# Patient Record
Sex: Male | Born: 1975 | Race: Black or African American | Hispanic: No | Marital: Single | Smoking: Current every day smoker
Health system: Southern US, Community
[De-identification: ages and names within clinical notes are randomized; demographics above are authoritative.]

## PROBLEM LIST (undated history)

## (undated) DIAGNOSIS — K259 Gastric ulcer, unspecified as acute or chronic, without hemorrhage or perforation: Secondary | ICD-10-CM

## (undated) DIAGNOSIS — R51 Headache: Secondary | ICD-10-CM

## (undated) DIAGNOSIS — K4090 Unilateral inguinal hernia, without obstruction or gangrene, not specified as recurrent: Secondary | ICD-10-CM

## (undated) DIAGNOSIS — R519 Headache, unspecified: Secondary | ICD-10-CM

---

## 2009-02-27 ENCOUNTER — Ambulatory Visit: Payer: Self-pay | Admitting: Diagnostic Radiology

## 2009-02-27 ENCOUNTER — Emergency Department (HOSPITAL_BASED_OUTPATIENT_CLINIC_OR_DEPARTMENT_OTHER): Admission: EM | Admit: 2009-02-27 | Discharge: 2009-02-28 | Payer: Self-pay | Admitting: Emergency Medicine

## 2009-04-01 ENCOUNTER — Emergency Department (HOSPITAL_BASED_OUTPATIENT_CLINIC_OR_DEPARTMENT_OTHER): Admission: EM | Admit: 2009-04-01 | Discharge: 2009-04-02 | Payer: Self-pay | Admitting: Emergency Medicine

## 2010-03-30 LAB — CARBOXYHEMOGLOBIN: Carboxyhemoglobin: 13.2 % (ref 0.5–1.5)

## 2011-07-18 IMAGING — CR DG CHEST 2V
2 series · 2 of 2 positions shown · non-contrast
Comparison: None.

CLINICAL DATA: Left-sided chest pain since yesterday.  Pain
radiates under the arm.

CHEST - 2 VIEW

[w chest pa]
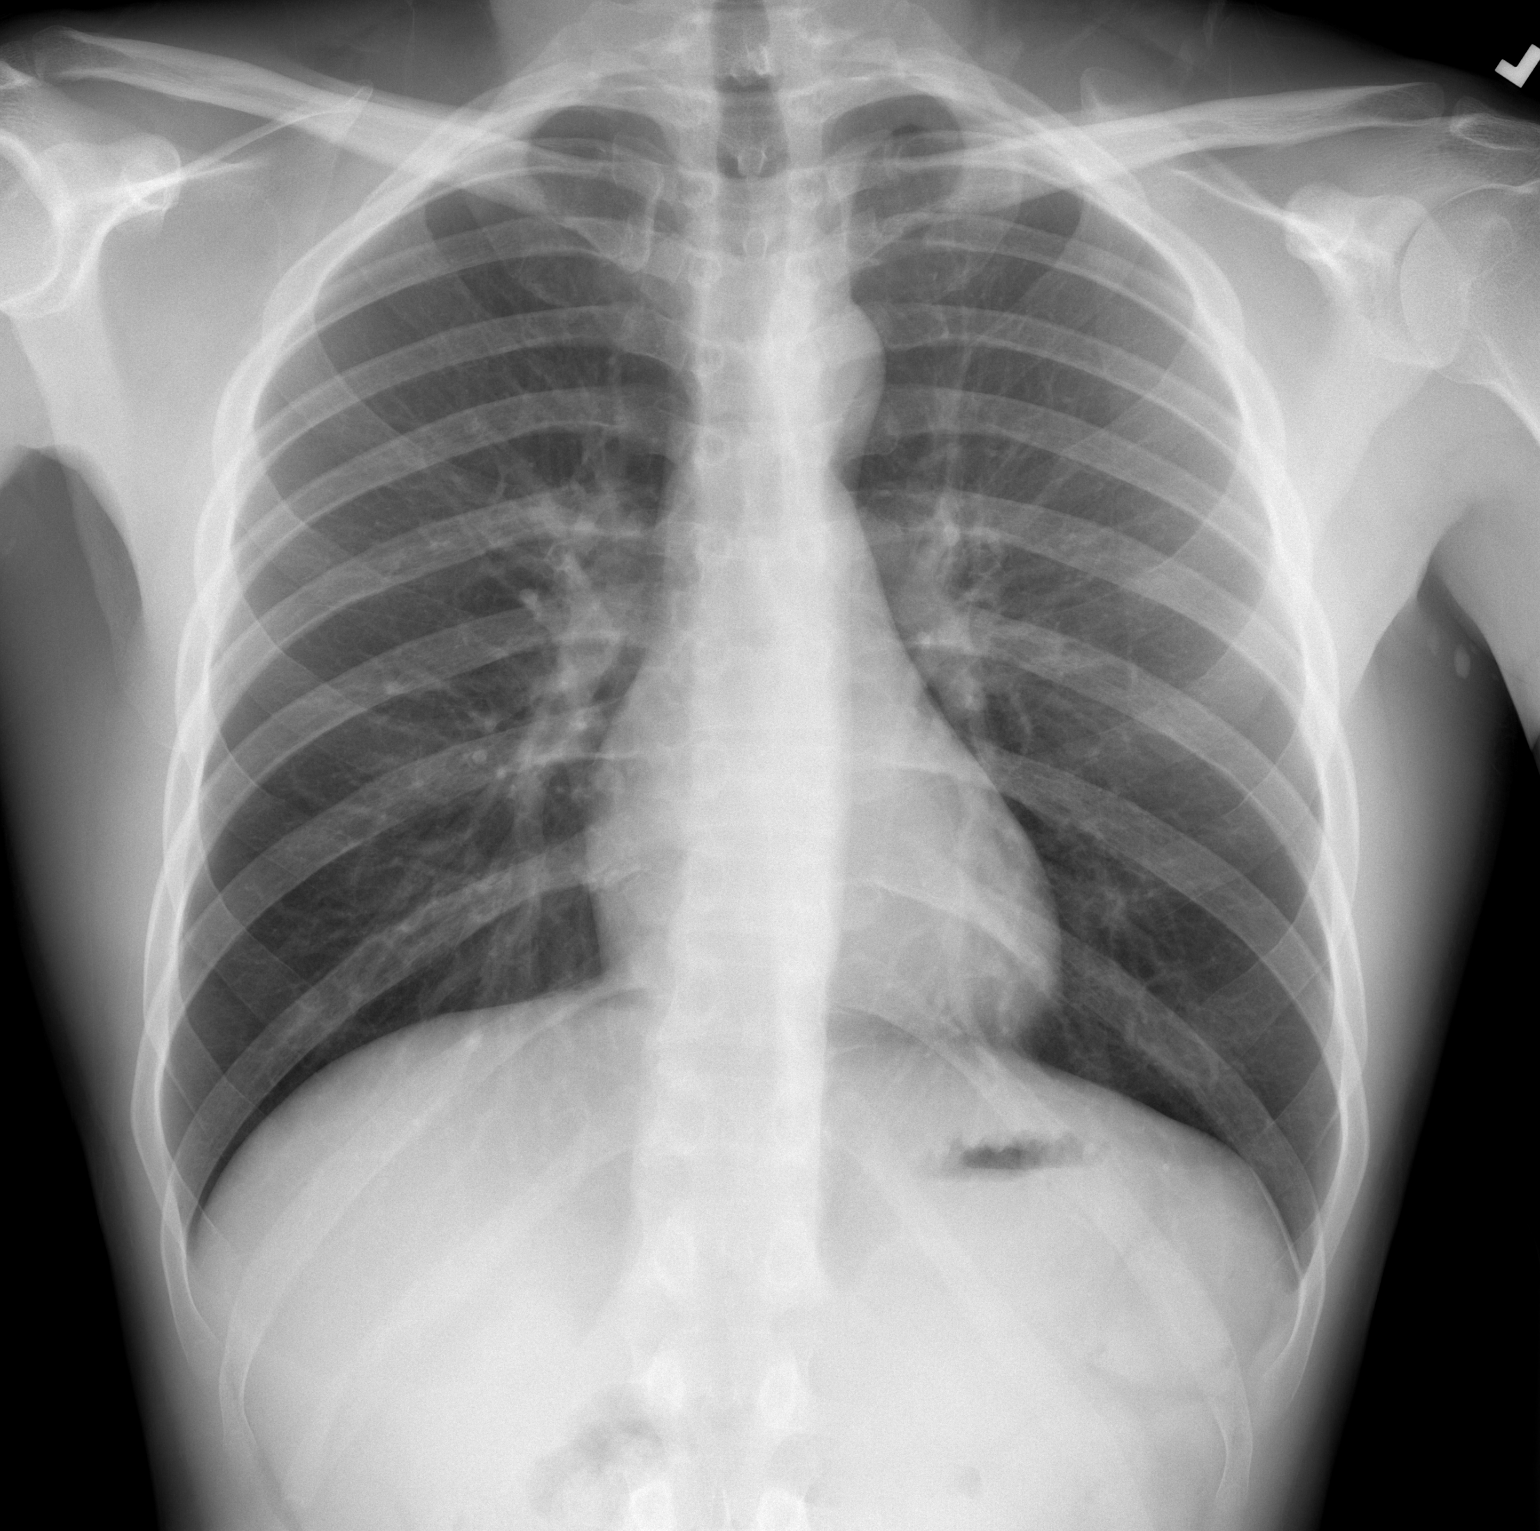

[w chest lat]
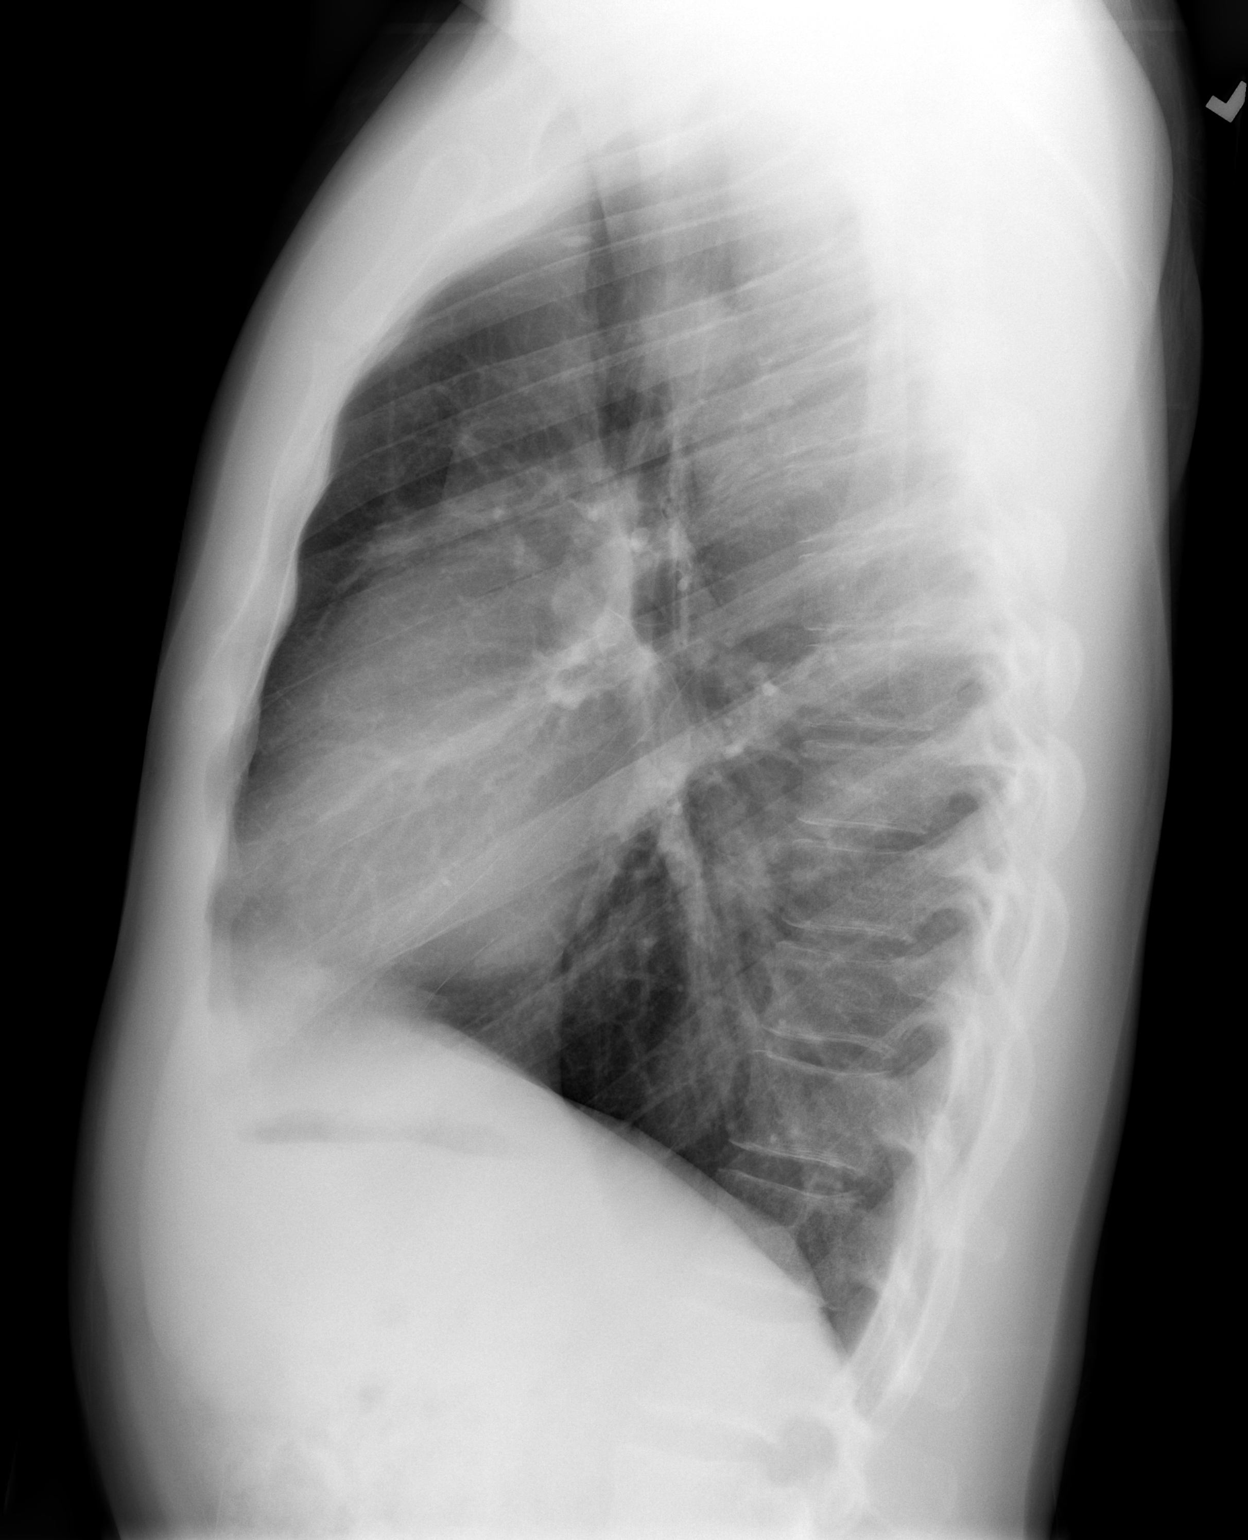

[2 of 2 positions shown; findings below may reference images not displayed]

FINDINGS: Cardiomediastinal silhouette is within normal limits.
The lungs are free of focal consolidations and pleural effusions.
Bony structures have a normal appearance.
IMPRESSION: Negative exam.

## 2012-10-31 ENCOUNTER — Encounter (HOSPITAL_BASED_OUTPATIENT_CLINIC_OR_DEPARTMENT_OTHER): Payer: Self-pay | Admitting: Emergency Medicine

## 2012-10-31 ENCOUNTER — Emergency Department (HOSPITAL_BASED_OUTPATIENT_CLINIC_OR_DEPARTMENT_OTHER)
Admission: EM | Admit: 2012-10-31 | Discharge: 2012-10-31 | Disposition: A | Discharge status: Home / Self Care | Payer: Self-pay | Attending: Emergency Medicine | Admitting: Emergency Medicine

## 2012-10-31 ENCOUNTER — Emergency Department (HOSPITAL_BASED_OUTPATIENT_CLINIC_OR_DEPARTMENT_OTHER): Payer: Self-pay

## 2012-10-31 DIAGNOSIS — R112 Nausea with vomiting, unspecified: Secondary | ICD-10-CM | POA: Insufficient documentation

## 2012-10-31 DIAGNOSIS — F17200 Nicotine dependence, unspecified, uncomplicated: Secondary | ICD-10-CM | POA: Insufficient documentation

## 2012-10-31 DIAGNOSIS — R109 Unspecified abdominal pain: Secondary | ICD-10-CM

## 2012-10-31 DIAGNOSIS — R197 Diarrhea, unspecified: Secondary | ICD-10-CM | POA: Insufficient documentation

## 2012-10-31 DIAGNOSIS — R630 Anorexia: Secondary | ICD-10-CM | POA: Insufficient documentation

## 2012-10-31 DIAGNOSIS — R1032 Left lower quadrant pain: Secondary | ICD-10-CM | POA: Insufficient documentation

## 2012-10-31 DIAGNOSIS — Z8719 Personal history of other diseases of the digestive system: Secondary | ICD-10-CM | POA: Insufficient documentation

## 2012-10-31 DIAGNOSIS — Z8711 Personal history of peptic ulcer disease: Secondary | ICD-10-CM | POA: Insufficient documentation

## 2012-10-31 HISTORY — DX: Gastric ulcer, unspecified as acute or chronic, without hemorrhage or perforation: K25.9

## 2012-10-31 HISTORY — DX: Unilateral inguinal hernia, without obstruction or gangrene, not specified as recurrent: K40.90

## 2012-10-31 LAB — CBC WITH DIFFERENTIAL/PLATELET
Lymphocytes Relative: 27 % (ref 12–46)
Lymphs Abs: 1.8 10*3/uL (ref 0.7–4.0)
MCHC: 33.7 g/dL (ref 30.0–36.0)
Monocytes Absolute: 0.8 10*3/uL (ref 0.1–1.0)
Neutrophils Relative %: 59 % (ref 43–77)
Platelets: 213 10*3/uL (ref 150–400)
RBC: 4.96 MIL/uL (ref 4.22–5.81)
RDW: 14.7 % (ref 11.5–15.5)

## 2012-10-31 LAB — COMPREHENSIVE METABOLIC PANEL
ALT: 16 U/L (ref 0–53)
AST: 26 U/L (ref 0–37)
Albumin: 4 g/dL (ref 3.5–5.2)
Alkaline Phosphatase: 67 U/L (ref 39–117)
Calcium: 9.7 mg/dL (ref 8.4–10.5)
Creatinine, Ser: 1.6 mg/dL — ABNORMAL HIGH (ref 0.50–1.35)
GFR calc Af Amer: 62 mL/min — ABNORMAL LOW (ref >90)
Glucose, Bld: 95 mg/dL (ref 70–99)
Total Protein: 7.8 g/dL (ref 6.0–8.3)

## 2012-10-31 LAB — URINE MICROSCOPIC-ADD ON

## 2012-10-31 LAB — URINALYSIS, ROUTINE W REFLEX MICROSCOPIC
Bilirubin Urine: NEGATIVE
Ketones, ur: NEGATIVE mg/dL
pH: 6 (ref 5.0–8.0)

## 2012-10-31 LAB — LIPASE, BLOOD: Lipase: 20 U/L (ref 11–59)

## 2012-10-31 MED ORDER — IOHEXOL 300 MG/ML  SOLN
50.0000 mL | Freq: Once | INTRAMUSCULAR | Status: AC | PRN
  Administered 2012-10-31: 50 mL via ORAL

## 2012-10-31 MED ORDER — OXYCODONE-ACETAMINOPHEN 5-325 MG PO TABS: 1.0000 | ORAL_TABLET | Freq: Four times a day (QID) | ORAL | Status: DC | PRN

## 2012-10-31 MED ORDER — SODIUM CHLORIDE 0.9 % IV BOLUS (SEPSIS)
1000.0000 mL | Freq: Once | INTRAVENOUS | Status: AC
  Administered 2012-10-31: 1000 mL via INTRAVENOUS

## 2012-10-31 MED ORDER — ONDANSETRON HCL 4 MG/2ML IJ SOLN
4.0000 mg | Freq: Once | INTRAMUSCULAR | Status: AC
  Administered 2012-10-31: 4 mg via INTRAVENOUS
  Filled 2012-10-31: qty 2

## 2012-10-31 MED ORDER — ONDANSETRON HCL 4 MG PO TABS: 4.0000 mg | ORAL_TABLET | Freq: Four times a day (QID) | ORAL | Status: DC

## 2012-10-31 MED ORDER — MORPHINE SULFATE 4 MG/ML IJ SOLN
4.0000 mg | Freq: Once | INTRAMUSCULAR | Status: AC
  Administered 2012-10-31: 4 mg via INTRAVENOUS
  Filled 2012-10-31: qty 1

## 2012-10-31 MED ORDER — IOHEXOL 300 MG/ML  SOLN
80.0000 mL | Freq: Once | INTRAMUSCULAR | Status: AC | PRN
  Administered 2012-10-31: 80 mL via INTRAVENOUS

## 2012-10-31 NOTE — ED Provider Notes (Signed)
CSN: [id number]     Arrival date & time 10/31/12  7829 History   First MD Initiated Contact with Patient 10/31/12 0740     Chief Complaint  Patient presents with  . Abdominal Pain   (Consider location/radiation/quality/duration/timing/severity/associated sxs/prior Treatment) Patient is a 37 y.o. male presenting with abdominal pain. The history is provided by the patient and the spouse. No language interpreter was used.  Abdominal Pain Pain location:  LLQ Pain quality: aching and cramping   Pain radiates to:  Does not radiate Pain severity:  Severe Onset quality:  Unable to specify Duration:  2 weeks Timing:  Intermittent Progression:  Waxing and waning Chronicity:  New Context: sick contacts   Context: not recent illness, not suspicious food intake and not trauma   Relieved by:  Nothing Worsened by:  Eating Ineffective treatments:  None tried Associated symptoms: anorexia, diarrhea, nausea and vomiting   Associated symptoms: no chest pain, no chills, no constipation, no cough, no dysuria, no fatigue, no fever and no shortness of breath   Diarrhea:    Quality:  Semi-solid   Severity:  Mild   Duration:  2 weeks   Timing:  Sporadic   Progression:  Unchanged Vomiting:    Quality:  Stomach contents   Severity:  Mild   Duration:  2 weeks   Timing:  Sporadic   Progression:  Unchanged Risk factors comment:  Abx use about 1 month ago   Past Medical History  Diagnosis Date  . Hernia, inguinal     as a child, no repair  . Ulcer, stomach peptic    History reviewed. No pertinent past surgical history. No family history on file. History  Substance Use Topics  . Smoking status: Current Every Day Smoker    Types: Cigarettes  . Smokeless tobacco: Not on file  . Alcohol Use: No    Review of Systems  Constitutional: Negative for fever, chills, activity change, appetite change and fatigue.  HENT: Negative for congestion, facial swelling, rhinorrhea and trouble swallowing.    Eyes: Negative for photophobia and pain.  Respiratory: Negative for cough, chest tightness and shortness of breath.   Cardiovascular: Negative for chest pain and leg swelling.  Gastrointestinal: Positive for nausea, vomiting, abdominal pain, diarrhea and anorexia. Negative for constipation.  Endocrine: Negative for polydipsia and polyuria.  Genitourinary: Negative for dysuria, urgency, decreased urine volume and difficulty urinating.  Musculoskeletal: Negative for back pain and gait problem.  Skin: Negative for color change, rash and wound.  Allergic/Immunologic: Negative for immunocompromised state.  Neurological: Negative for dizziness, facial asymmetry, speech difficulty, weakness, numbness and headaches.  Psychiatric/Behavioral: Negative for confusion, decreased concentration and agitation.    Allergies  Dilaudid  Home Medications  No current outpatient prescriptions on file. BP 120/81  Temp(Src) 97.8 F (36.6 C) (Oral)  Resp 20  Ht 6\' 2"  (1.88 m)  Wt 165 lb (74.844 kg)  BMI 21.18 kg/m2  SpO2 100% Physical Exam  Constitutional: He is oriented to person, place, and time. He appears well-developed and well-nourished. No distress.  HENT:  Head: Normocephalic and atraumatic.  Mouth/Throat: No oropharyngeal exudate.  Eyes: Pupils are equal, round, and reactive to light.  Neck: Normal range of motion. Neck supple.  Cardiovascular: Normal rate, regular rhythm and normal heart sounds.  Exam reveals no gallop and no friction rub.   No murmur heard. Pulmonary/Chest: Effort normal and breath sounds normal. No respiratory distress. He has no wheezes. He has no rales.  Abdominal: Soft. Bowel sounds are normal.  He exhibits no distension and no mass. There is tenderness in the left lower quadrant. There is no rebound and no guarding.  Musculoskeletal: Normal range of motion. He exhibits no edema and no tenderness.  Neurological: He is alert and oriented to person, place, and time.   Skin: Skin is warm and dry.  Psychiatric: He has a normal mood and affect.    ED Course  Procedures (including critical care time) Labs Review Labs Reviewed  URINALYSIS, ROUTINE W REFLEX MICROSCOPIC  COMPREHENSIVE METABOLIC PANEL  CBC WITH DIFFERENTIAL  LIPASE, BLOOD   Imaging Review No results found.  EKG Interpretation   None       MDM  No diagnosis found. Pt is a 38 y.o. male with Pmhx as above who presents with 2 weeks crampy LLQ pain, n/v, d/a, chills.  On ABx about 1 month ago for abscess, sick contacts at work w/ n/v, d/a.  On PE VSS pt in NAD. +ttp LLQ w/o rebound or guarding. CT ab/pelvis w/o acute findings.  CBC, CMP, lipase, UA unremarkable.  Doubt acute surgical cause of pain, feel he most likely as viral gastroenteritis and feel he is safe for d/c.  Return precautions given for new or worsening symptoms including worsening pain, inability to tolerate PO.         Shanna Cisco, MD 11/01/12 1100

## 2012-10-31 NOTE — ED Notes (Signed)
Patient states he has a two week history of left lower quadrant abdominal pain which is associated with nausea and vomiting.

## 2013-08-28 ENCOUNTER — Encounter (HOSPITAL_BASED_OUTPATIENT_CLINIC_OR_DEPARTMENT_OTHER): Payer: Self-pay | Admitting: Emergency Medicine

## 2013-08-28 ENCOUNTER — Emergency Department (HOSPITAL_BASED_OUTPATIENT_CLINIC_OR_DEPARTMENT_OTHER)
Admission: EM | Admit: 2013-08-28 | Discharge: 2013-08-28 | Disposition: A | Discharge status: Home / Self Care | Payer: Self-pay | Attending: Emergency Medicine | Admitting: Emergency Medicine

## 2013-08-28 DIAGNOSIS — F17200 Nicotine dependence, unspecified, uncomplicated: Secondary | ICD-10-CM | POA: Insufficient documentation

## 2013-08-28 DIAGNOSIS — IMO0002 Reserved for concepts with insufficient information to code with codable children: Secondary | ICD-10-CM | POA: Insufficient documentation

## 2013-08-28 DIAGNOSIS — L03114 Cellulitis of left upper limb: Secondary | ICD-10-CM

## 2013-08-28 DIAGNOSIS — Z8719 Personal history of other diseases of the digestive system: Secondary | ICD-10-CM | POA: Insufficient documentation

## 2013-08-28 DIAGNOSIS — Z79899 Other long term (current) drug therapy: Secondary | ICD-10-CM | POA: Insufficient documentation

## 2013-08-28 DIAGNOSIS — Y9289 Other specified places as the place of occurrence of the external cause: Secondary | ICD-10-CM | POA: Insufficient documentation

## 2013-08-28 DIAGNOSIS — IMO0001 Reserved for inherently not codable concepts without codable children: Secondary | ICD-10-CM | POA: Insufficient documentation

## 2013-08-28 DIAGNOSIS — R21 Rash and other nonspecific skin eruption: Secondary | ICD-10-CM | POA: Insufficient documentation

## 2013-08-28 DIAGNOSIS — Y9389 Activity, other specified: Secondary | ICD-10-CM | POA: Insufficient documentation

## 2013-08-28 MED ORDER — HYDROCODONE-ACETAMINOPHEN 5-325 MG PO TABS: 1.0000 | ORAL_TABLET | ORAL | Status: AC | PRN

## 2013-08-28 MED ORDER — HYDROCODONE-ACETAMINOPHEN 5-325 MG PO TABS
2.0000 | ORAL_TABLET | Freq: Once | ORAL | Status: AC
  Administered 2013-08-28: 2 via ORAL
  Filled 2013-08-28: qty 2

## 2013-08-28 MED ORDER — CLINDAMYCIN HCL 150 MG PO CAPS: 150.0000 mg | ORAL_CAPSULE | Freq: Four times a day (QID) | ORAL | Status: AC

## 2013-08-28 MED ORDER — CLINDAMYCIN HCL 150 MG PO CAPS
300.0000 mg | ORAL_CAPSULE | Freq: Once | ORAL | Status: AC
  Administered 2013-08-28: 300 mg via ORAL
  Filled 2013-08-28: qty 2

## 2013-08-28 NOTE — ED Provider Notes (Signed)
CSN: [id number]     Arrival date & time 08/28/13  0807 History   First MD Initiated Contact with Patient 08/28/13 0813     Chief Complaint  Patient presents with  . Insect Bite     HPI  Patient presents with pain and swelling of his left arm, forearm. He alleges he may been bitten by something. Recently noticed redness and pain area. He states he "looked like a pimple". He squeezed a previous no drainage from it. It is tender and firm and he presents here. No fevers no chills does not feel sick in general.  Past Medical History  Diagnosis Date  . Hernia, inguinal     as a child, no repair  . Ulcer, stomach peptic    History reviewed. No pertinent past surgical history. No family history on file. History  Substance Use Topics  . Smoking status: Current Every Day Smoker -- 0.50 packs/day    Types: Cigarettes  . Smokeless tobacco: Not on file  . Alcohol Use: No    Review of Systems  Constitutional: Negative for fever, chills, diaphoresis, appetite change and fatigue.  HENT: Negative for mouth sores, sore throat and trouble swallowing.   Eyes: Negative for visual disturbance.  Respiratory: Negative for cough, chest tightness, shortness of breath and wheezing.   Cardiovascular: Negative for chest pain.  Gastrointestinal: Negative for nausea, vomiting, abdominal pain, diarrhea and abdominal distention.  Endocrine: Negative for polydipsia, polyphagia and polyuria.  Genitourinary: Negative for dysuria, frequency and hematuria.  Musculoskeletal: Negative for gait problem.  Skin: Positive for color change and rash. Negative for pallor.  Neurological: Negative for dizziness, syncope, light-headedness and headaches.  Hematological: Does not bruise/bleed easily.  Psychiatric/Behavioral: Negative for behavioral problems and confusion.      Allergies  Dilaudid  Home Medications   Prior to Admission medications   Medication Sig Start Date End Date Taking? Authorizing Provider    clindamycin (CLEOCIN) 150 MG capsule Take 1 capsule (150 mg total) by mouth every 6 (six) hours. 08/28/13   Rolland Porter, MD  HYDROcodone-acetaminophen (NORCO/VICODIN) 5-325 MG per tablet Take 1 tablet by mouth every 4 (four) hours as needed. 08/28/13   Rolland Porter, MD  ondansetron (ZOFRAN) 4 MG tablet Take 1 tablet (4 mg total) by mouth every 6 (six) hours. 10/31/12   Toy Cookey, MD  oxyCODONE-acetaminophen (PERCOCET) 5-325 MG per tablet Take 1 tablet by mouth every 6 (six) hours as needed for pain. 10/31/12   Toy Cookey, MD   BP 111/70  Pulse 72  Temp(Src) 98.2 F (36.8 C) (Oral)  Resp 14  Ht  (1.88 m)  Wt 174 lb (78.926 kg)  BMI 22.33 kg/m2  SpO2 100% Physical Exam  Constitutional: He is oriented to person, place, and time. He appears well-developed and well-nourished. No distress.  HENT:  Head: Normocephalic.  Eyes: Conjunctivae are normal. Pupils are equal, round, and reactive to light. No scleral icterus.  Neck: Normal range of motion. Neck supple. No thyromegaly present.  Cardiovascular: Normal rate and regular rhythm.  Exam reveals no gallop and no friction rub.   No murmur heard. Pulmonary/Chest: Effort normal and breath sounds normal. No respiratory distress. He has no wheezes. He has no rales.  Abdominal: Soft. Bowel sounds are normal. He exhibits no distension. There is no tenderness. There is no rebound.  Musculoskeletal: Normal range of motion.       Arms: Neurological: He is alert and oriented to person, place, and time.  Skin: Skin is warm  and dry. No rash noted.  Psychiatric: He has a normal mood and affect. His behavior is normal.    ED Course  Procedures (including critical care time) Labs Review Labs Reviewed - No data to display  Imaging Review No results found.   EKG Interpretation None      MDM   Final diagnoses:  Cellulitis of left upper extremity    Quickly a cellulitis with induration. No fluctuance by exam. No fluid collection by  ultrasound. Plan:   warm compresses. Covered with clindamycin for possible MRSA. Recheck here with any worsening symptoms including lymphangitis or worsening pain or swelling.    Rolland Porter, MD 08/28/13 [phone]

## 2013-08-28 NOTE — ED Notes (Signed)
States he noticed ?bug bite on left arm. Arm swollen and red.

## 2013-08-28 NOTE — ED Notes (Signed)
Departure condition doc. At 0851 documented in error.

## 2013-08-28 NOTE — Discharge Instructions (Signed)
Warm compresses. Return to ER if your swelling is not improved in the next 48-72 hours.  Cellulitis Cellulitis is an infection of the skin and the tissue under the skin. The infected area is usually red and tender. This happens most often in the arms and lower legs. HOME CARE   Take your antibiotic medicine as told. Finish the medicine even if you start to feel better.  Keep the infected arm or leg raised (elevated).  Put a warm cloth on the area up to 4 times per day.  Only take medicines as told by your doctor.  Keep all doctor visits as told. GET HELP IF:  You see red streaks on the skin coming from the infected area.  Your red area gets bigger or turns a dark color.  Your bone or joint under the infected area is painful after the skin heals.  Your infection comes back in the same area or different area.  You have a puffy (swollen) bump in the infected area.  You have new symptoms.  You have a fever. GET HELP RIGHT AWAY IF:   You feel very sleepy.  You throw up (vomit) or have watery poop (diarrhea).  You feel sick and have muscle aches and pains. MAKE SURE YOU:   Understand these instructions.  Will watch your condition.  Will get help right away if you are not doing well or get worse. Document Released: 06/09/2007 Document Revised: 05/07/2013 Document Reviewed: 03/08/2011 [facility] Patient Information 2015 [place], [place]. This information is not intended to replace advice given to you by your health care provider. Make sure you discuss any questions you have with your health care provider.

## 2013-08-31 ENCOUNTER — Encounter (HOSPITAL_BASED_OUTPATIENT_CLINIC_OR_DEPARTMENT_OTHER): Payer: Self-pay | Admitting: Emergency Medicine

## 2013-08-31 ENCOUNTER — Emergency Department (HOSPITAL_BASED_OUTPATIENT_CLINIC_OR_DEPARTMENT_OTHER)
Admission: EM | Admit: 2013-08-31 | Discharge: 2013-08-31 | Disposition: A | Discharge status: Home / Self Care | Payer: Self-pay | Attending: Emergency Medicine | Admitting: Emergency Medicine

## 2013-08-31 DIAGNOSIS — F17200 Nicotine dependence, unspecified, uncomplicated: Secondary | ICD-10-CM | POA: Insufficient documentation

## 2013-08-31 DIAGNOSIS — Z792 Long term (current) use of antibiotics: Secondary | ICD-10-CM | POA: Insufficient documentation

## 2013-08-31 DIAGNOSIS — IMO0002 Reserved for concepts with insufficient information to code with codable children: Secondary | ICD-10-CM | POA: Insufficient documentation

## 2013-08-31 DIAGNOSIS — L02414 Cutaneous abscess of left upper limb: Secondary | ICD-10-CM

## 2013-08-31 MED ORDER — OXYCODONE-ACETAMINOPHEN 5-325 MG PO TABS
2.0000 | ORAL_TABLET | Freq: Once | ORAL | Status: AC
  Administered 2013-08-31: 2 via ORAL
  Filled 2013-08-31: qty 2

## 2013-08-31 NOTE — ED Provider Notes (Signed)
CSN: [id number]     Arrival date & time 08/31/13  0820 History   First MD Initiated Contact with Patient 08/31/13 [phone]     Chief Complaint  Patient presents with  . Wound Check     (Consider location/radiation/quality/duration/timing/severity/associated sxs/prior Treatment) HPI 38 year old male presents for reevaluation of cellulitis left ulnar forearm. He had an area that became red on Sunday and looked like it had a pustule. He was seen here on Monday and evaluated for this area that was not felt to have a drainable abscess. He was placed on clindamycin and has been taking this. They have noticed increased greenish yellow drainage from a point in the middle of this. The surrounding erythema has decreased. The patient is still complaining of pain in left forearm. He has not had fever or chills. He has no history of immunosuppressed condition. He has not noted any redness or swelling of the arm or in the left axilla. Past Medical History  Diagnosis Date  . Hernia, inguinal     as a child, no repair  . Ulcer, stomach peptic    No past surgical history on file. No family history on file. History  Substance Use Topics  . Smoking status: Current Every Day Smoker -- 0.50 packs/day    Types: Cigarettes  . Smokeless tobacco: Not on file  . Alcohol Use: No    Review of Systems  All other systems reviewed and are negative.     Allergies  Dilaudid  Home Medications   Prior to Admission medications   Medication Sig Start Date End Date Taking? Authorizing Provider  clindamycin (CLEOCIN) 150 MG capsule Take 1 capsule (150 mg total) by mouth every 6 (six) hours. 08/28/13  Yes Rolland Porter, MD  HYDROcodone-acetaminophen (NORCO/VICODIN) 5-325 MG per tablet Take 1 tablet by mouth every 4 (four) hours as needed. 08/28/13  Yes Rolland Porter, MD   BP 122/76  Pulse 68  Temp(Src) 98.1 F (36.7 C) (Oral)  Resp 18  Ht  (1.88 m)  SpO2 100% Physical Exam  Nursing note and vitals  reviewed. Constitutional: He is oriented to person, place, and time. He appears well-developed and well-nourished.  HENT:  Head: Normocephalic and atraumatic.  Eyes: Pupils are equal, round, and reactive to light.  Musculoskeletal: Normal range of motion. He exhibits tenderness.       Arms: Indurated tender area left ulnar medial forearm 4 x 6 cm with a pustule in the center  Neurological: He is alert and oriented to person, place, and time. No cranial nerve deficit. Coordination normal.  Skin: Skin is warm and dry.  Psychiatric: He has a normal mood and affect. His behavior is normal.    ED Course  INCISION AND DRAINAGE Date/Time: 08/31/2013 9:08 AM Performed by: Hilario Quarry Authorized by: Hilario Quarry Consent: Verbal consent obtained. Risks and benefits: risks, benefits and alternatives were discussed Patient identity confirmed: verbally with patient Time out: Immediately prior to procedure a "time out" was called to verify the correct patient, procedure, equipment, support staff and site/side marked as required. Type: abscess Body area: upper extremity Anesthesia: local infiltration Local anesthetic: lidocaine 1% with epinephrine Anesthetic total: 2.5 ml Patient sedated: no Scalpel size: 11 Needle gauge: 22 Incision type: single straight Complexity: simple Drainage: serosanguinous Drainage amount: scant Wound treatment: wound left open Patient tolerance: Patient tolerated the procedure well with no immediate complications.   (including critical care time) Labs Review Labs Reviewed - No data to display  Imaging Review  No results found.   EKG Interpretation None      MDM   Final diagnoses:  Abscess of left forearm    The patient counseled to continue antibiotics, warm compresses, and elevation. He is also counseled regarding return precautions and voices understanding.    Hilario Quarry, MD 08/31/13 [phone]

## 2013-08-31 NOTE — Discharge Instructions (Signed)
Abscess °An abscess (boil or furuncle) is an infected area on or under the skin. This area is filled with yellowish-white fluid (pus) and other material (debris). °HOME CARE  °· Only take medicines as told by your doctor. °· If you were given antibiotic medicine, take it as directed. Finish the medicine even if you start to feel better. °· If gauze is used, follow your doctor's directions for changing the gauze. °· To avoid spreading the infection: °¨ Keep your abscess covered with a bandage. °¨ Wash your hands well. °¨ Do not share personal care items, towels, or whirlpools with others. °¨ Avoid skin contact with others. °· Keep your skin and clothes clean around the abscess. °· Keep all doctor visits as told. °GET HELP RIGHT AWAY IF:  °· You have more pain, puffiness (swelling), or redness in the wound site. °· You have more fluid or blood coming from the wound site. °· You have muscle aches, chills, or you feel sick. °· You have a fever. °MAKE SURE YOU:  °· Understand these instructions. °· Will watch your condition. °· Will get help right away if you are not doing well or get worse. °Document Released: 06/09/2007 Document Revised: 06/22/2011 Document Reviewed: 03/05/2011 °[employer]® Patient Information ©2015 [employer]. This information is not intended to replace advice given to you by your health care provider. Make sure you discuss any questions you have with your health care provider. ° °

## 2013-08-31 NOTE — ED Notes (Signed)
Pt seen Monday for insect bite to left arm.  Pt states it has gotten more swollen and hard,   Draining green/yellow pus.  Pt states he is having difficulty moving his arm and hand.

## 2014-09-16 ENCOUNTER — Encounter (HOSPITAL_BASED_OUTPATIENT_CLINIC_OR_DEPARTMENT_OTHER): Payer: Self-pay | Admitting: *Deleted

## 2014-09-16 ENCOUNTER — Emergency Department (HOSPITAL_BASED_OUTPATIENT_CLINIC_OR_DEPARTMENT_OTHER)
Admission: EM | Admit: 2014-09-16 | Discharge: 2014-09-16 | Disposition: A | Discharge status: Home / Self Care | Payer: Self-pay | Attending: Emergency Medicine | Admitting: Emergency Medicine

## 2014-09-16 DIAGNOSIS — H938X9 Other specified disorders of ear, unspecified ear: Secondary | ICD-10-CM | POA: Insufficient documentation

## 2014-09-16 DIAGNOSIS — J0100 Acute maxillary sinusitis, unspecified: Secondary | ICD-10-CM | POA: Insufficient documentation

## 2014-09-16 DIAGNOSIS — Z720 Tobacco use: Secondary | ICD-10-CM | POA: Insufficient documentation

## 2014-09-16 DIAGNOSIS — Z8719 Personal history of other diseases of the digestive system: Secondary | ICD-10-CM | POA: Insufficient documentation

## 2014-09-16 HISTORY — DX: Headache, unspecified: R51.9

## 2014-09-16 HISTORY — DX: Headache: R51

## 2014-09-16 MED ORDER — SALINE SPRAY 0.65 % NA SOLN: 1.0000 | NASAL | Status: AC | PRN

## 2014-09-16 MED ORDER — AMOXICILLIN 500 MG PO CAPS: 500.0000 mg | ORAL_CAPSULE | Freq: Three times a day (TID) | ORAL | Status: AC

## 2014-09-16 NOTE — ED Provider Notes (Signed)
CSN: [id number]     Arrival date & time 09/16/14  1758 History   First MD Initiated Contact with Patient 09/16/14 1804     Chief Complaint  Patient presents with  . URI     (Consider location/radiation/quality/duration/timing/severity/associated sxs/prior Treatment) HPI Comments: Patient presents to the emergency department with chief complaint of sinus headache, sinus congestion, ear fullness, rhinorrhea, sore throat, and dry cough. He states that the symptoms make it until short of breath. He denies any chest pain, or abdominal pain. He has been taking Mucinex with minimal relief. He denies any fevers or chills. There are no aggravating or alleviating factors.  The history is provided by the patient. No language interpreter was used.    Past Medical History  Diagnosis Date  . Hernia, inguinal     as a child, no repair  . Ulcer, stomach peptic   . Generalized headaches    History reviewed. No pertinent past surgical history. No family history on file. Social History  Substance Use Topics  . Smoking status: Current Every Day Smoker -- 0.50 packs/day    Types: Cigarettes  . Smokeless tobacco: None  . Alcohol Use: No    Review of Systems  Constitutional: Positive for chills. Negative for fever.  HENT: Positive for postnasal drip, rhinorrhea, sinus pressure, sneezing and sore throat.   Respiratory: Positive for cough. Negative for shortness of breath.   Cardiovascular: Negative for chest pain.  Gastrointestinal: Negative for nausea, vomiting, abdominal pain, diarrhea and constipation.  Genitourinary: Negative for dysuria.  All other systems reviewed and are negative.     Allergies  Dilaudid  Home Medications   Prior to Admission medications   Medication Sig Start Date End Date Taking? Authorizing Provider  clindamycin (CLEOCIN) 150 MG capsule Take 1 capsule (150 mg total) by mouth every 6 (six) hours. 08/28/13   Rolland Porter, MD  HYDROcodone-acetaminophen (NORCO/VICODIN)  5-325 MG per tablet Take 1 tablet by mouth every 4 (four) hours as needed. 08/28/13   Rolland Porter, MD   BP 120/76 mmHg  Pulse 72  Temp(Src) 98 F (36.7 C) (Oral)  Resp 18  Ht 6\' 2"  (1.88 m)  Wt 170 lb (77.111 kg)  BMI 21.82 kg/m2  SpO2 100% Physical Exam  Constitutional: He is oriented to person, place, and time. He appears well-developed and well-nourished.  HENT:  Head: Normocephalic and atraumatic.  Right Ear: External ear normal.  Left Ear: External ear normal.  Mouth/Throat: Oropharynx is clear and moist. No oropharyngeal exudate.  Swollen, erythematous turbinates, maxillary sinuses tender to palpation  Eyes: Conjunctivae and EOM are normal. Pupils are equal, round, and reactive to light.  Neck: Normal range of motion. Neck supple.  Cardiovascular: Normal rate, regular rhythm and normal heart sounds.   Pulmonary/Chest: Effort normal and breath sounds normal. No respiratory distress. He has no wheezes. He has no rales. He exhibits no tenderness.  CTAB  Abdominal: Soft. Bowel sounds are normal. He exhibits no distension and no mass. There is no tenderness. There is no rebound and no guarding.  Musculoskeletal: Normal range of motion.  Neurological: He is alert and oriented to person, place, and time.  Skin: Skin is warm and dry.  Psychiatric: He has a normal mood and affect. His behavior is normal. Judgment and thought content normal.  Nursing note and vitals reviewed.   ED Course  Procedures (including critical care time)   MDM   Final diagnoses:  Acute maxillary sinusitis, recurrence not specified    Patient with  URI vs sinusitis.  Will give amox.  DC to home.    Roxy Horseman, PA-C 09/16/14 1822  Gwyneth Sprout, MD 09/17/14 [phone]

## 2014-09-16 NOTE — ED Notes (Signed)
Facial pain, ear pain, sinus pain. States he feels sob. Speaking in complete sentences. No respiratory distress.

## 2014-09-16 NOTE — Discharge Instructions (Signed)

## 2014-11-02 ENCOUNTER — Emergency Department (HOSPITAL_BASED_OUTPATIENT_CLINIC_OR_DEPARTMENT_OTHER)
Admission: EM | Admit: 2014-11-02 | Discharge: 2014-11-02 | Disposition: A | Discharge status: Home / Self Care | Payer: Self-pay | Attending: Emergency Medicine | Admitting: Emergency Medicine

## 2014-11-02 ENCOUNTER — Encounter (HOSPITAL_BASED_OUTPATIENT_CLINIC_OR_DEPARTMENT_OTHER): Payer: Self-pay

## 2014-11-02 DIAGNOSIS — Z792 Long term (current) use of antibiotics: Secondary | ICD-10-CM | POA: Insufficient documentation

## 2014-11-02 DIAGNOSIS — Z8669 Personal history of other diseases of the nervous system and sense organs: Secondary | ICD-10-CM

## 2014-11-02 DIAGNOSIS — Z8719 Personal history of other diseases of the digestive system: Secondary | ICD-10-CM | POA: Insufficient documentation

## 2014-11-02 DIAGNOSIS — Z8679 Personal history of other diseases of the circulatory system: Secondary | ICD-10-CM | POA: Insufficient documentation

## 2014-11-02 DIAGNOSIS — R51 Headache: Secondary | ICD-10-CM | POA: Insufficient documentation

## 2014-11-02 DIAGNOSIS — H578 Other specified disorders of eye and adnexa: Secondary | ICD-10-CM | POA: Insufficient documentation

## 2014-11-02 DIAGNOSIS — H5712 Ocular pain, left eye: Secondary | ICD-10-CM | POA: Insufficient documentation

## 2014-11-02 DIAGNOSIS — H538 Other visual disturbances: Secondary | ICD-10-CM | POA: Insufficient documentation

## 2014-11-02 DIAGNOSIS — F17210 Nicotine dependence, cigarettes, uncomplicated: Secondary | ICD-10-CM | POA: Insufficient documentation

## 2014-11-02 DIAGNOSIS — R112 Nausea with vomiting, unspecified: Secondary | ICD-10-CM | POA: Insufficient documentation

## 2014-11-02 MED ORDER — GENTAMICIN SULFATE 0.3 % OP SOLN: 2.0000 [drp] | Freq: Four times a day (QID) | OPHTHALMIC | Status: AC

## 2014-11-02 NOTE — ED Notes (Addendum)
Pt reports Tuesday he had a migraine, reports he has had left eye discomfort, blurred vision, swelling since Tuesday.

## 2014-11-02 NOTE — Discharge Instructions (Signed)
If you continue to have worsening drainage or discharge, fill the prescription for eyedrops you were given.  Return to the emergency department if your symptoms significantly worsen or change.   Blurred Vision Having blurred vision means that you cannot see things clearly. Your vision may seem fuzzy or out of focus. Blurred vision is a very common symptom of an eye or vision problem. Blurred vision is often a gradual blur that occurs in one eye or both eyes. There are many causes of blurred vision, including cataracts, macular degeneration, and diabetic retinopathy. Blurred vision can be diagnosed based on your symptoms and a physical exam. Tell your health care provider about any other health problems you have, any recent eye injury, and any prior surgeries. You may need to see a health care provider who specializes in eye problems (ophthalmologist). Your treatment depends on what is causing your blurred vision.  HOME CARE INSTRUCTIONS  Tell your health care provider about any changes in your blurred vision.  Do not drive or operate heavy machinery if your vision is blurry.  Keep all follow-up visits as directed by your health care provider. This is important. SEEK MEDICAL CARE IF:  Your symptoms get worse.  You have new symptoms.  You have trouble seeing at night.  You have trouble seeing up close or far away.  You have trouble noticing the difference between colors. SEEK IMMEDIATE MEDICAL CARE IF:  You have severe eye pain.  You have a severe headache.  You have flashing lights in your field of vision.  You have a sudden change in vision.  You have a sudden loss of vision.  You have vision change after an injury.  You notice drainage coming from your eyes.  You notice a rash around your eyes.   This information is not intended to replace advice given to you by your health care provider. Make sure you discuss any questions you have with your health care provider.     Document Released: 12/24/2002 Document Revised: 05/07/2014 Document Reviewed: 11/14/2013 [employer] Interactive Patient Education [employer]2016 [employer]

## 2014-11-02 NOTE — ED Provider Notes (Signed)
CSN: [id number]     Arrival date & time 11/02/14  2248 History  By signing my name below, I, Russell Sanchez, attest that this documentation has been prepared under the direction and in the presence of Geoffery Lyons, MD. Electronically Signed: Budd Sanchez, ED Scribe. 11/02/2014. 11:31 PM.    Chief Complaint  Patient presents with  . Blurred Vision   Patient is a 39 y.o. male presenting with eye problem. The history is provided by the patient and the spouse. No language interpreter was used.  Eye Problem Location:  L eye Onset quality:  Sudden Duration:  4 days Timing:  Intermittent Chronicity:  New Associated symptoms: blurred vision, crusting, discharge, headaches, nausea, redness, swelling and vomiting    HPI Comments: Russell Sanchez is a 39 y.o. male smoker at 0.5 ppd who presents to the Emergency Department complaining of blurred vision in the left eye onset 4 days ago. He reports associated discharge, crusting over, redness, swelling to the eye lids, and pain in the left eye. He states it feels as though "there is a film over the eye." Per wife, once pt blinks there is a clear-looking buildup in the eye. He notes he had a migraine 4 days ago, for which he was seen in the ED. He also reports a PMHx of migraines. Per wife, pt also has n/v from the migraine, which is ongoing. She denies any sick children at home.   Past Medical History  Diagnosis Date  . Hernia, inguinal     as a child, no repair  . Ulcer, stomach peptic   . Generalized headaches    History reviewed. No pertinent past surgical history. History reviewed. No pertinent family history. Social History  Substance Use Topics  . Smoking status: Current Every Day Smoker -- 0.50 packs/day    Types: Cigarettes  . Smokeless tobacco: None  . Alcohol Use: No    Review of Systems  Eyes: Positive for blurred vision, pain, discharge, redness and visual disturbance.  Gastrointestinal: Positive for nausea and vomiting.   Neurological: Positive for headaches.  All other systems reviewed and are negative.   Allergies  Dilaudid  Home Medications   Prior to Admission medications   Medication Sig Start Date End Date Taking? Authorizing Provider  amoxicillin (AMOXIL) 500 MG capsule Take 1 capsule (500 mg total) by mouth 3 (three) times daily. 09/16/14   Roxy Horseman, PA-C  clindamycin (CLEOCIN) 150 MG capsule Take 1 capsule (150 mg total) by mouth every 6 (six) hours. 08/28/13   Rolland Porter, MD  HYDROcodone-acetaminophen (NORCO/VICODIN) 5-325 MG per tablet Take 1 tablet by mouth every 4 (four) hours as needed. 08/28/13   Rolland Porter, MD  sodium chloride (OCEAN) 0.65 % SOLN nasal spray Place 1 spray into both nostrils as needed for congestion. 09/16/14   Roxy Horseman, PA-C   BP 125/72 mmHg  Pulse 95  Temp(Src) 98.1 F (36.7 C) (Oral)  Resp 18  Ht  (1.88 m)  Wt 192 lb (87.091 kg)  BMI 24.64 kg/m2  SpO2 98% Physical Exam  Constitutional: He is oriented to person, place, and time. He appears well-developed and well-nourished.  HENT:  Head: Normocephalic.  Eyes: EOM are normal. Pupils are equal, round, and reactive to light.  L eye appears grossly normal. There is no obvious discharge or conjunctival injection. No papilledema.   Neck: Normal range of motion.  Pulmonary/Chest: Effort normal.  Abdominal: He exhibits no distension.  Musculoskeletal: Normal range of motion.  Neurological: He is alert  and oriented to person, place, and time.  Psychiatric: He has a normal mood and affect.  Nursing note and vitals reviewed.   ED Course  Procedures  DIAGNOSTIC STUDIES: Oxygen Saturation is 98% on RA, normal by my interpretation.    COORDINATION OF CARE: 11:17 PM - Discussed plans to perform a visual acuity exam. Pt advised of plan for treatment and pt agrees.  Labs Review Labs Reviewed - No data to display  Imaging Review No results found. I have personally reviewed and evaluated these images  and lab results as part of my medical decision-making.   EKG Interpretation None      MDM   Final diagnoses:  None    Patient presents with headaches and blurry vision. Has history of migraines and this feels similar. He does report some drainage from the left eye. This will be treated as possible pinkeye with antibiotics drops. He is advised to watch his caffeine intake, continue pain medication as needed, and return as needed for any problems. He is neurologically intact and I see no red flags on his exam that warrant imaging or further workup.   I personally performed the services described in this documentation, which was scribed in my presence. The recorded information has been reviewed and is accurate.       Geoffery Lyonsouglas Tyton Abdallah, MD 11/03/14 [phone]

## 2017-03-26 ENCOUNTER — Emergency Department (HOSPITAL_BASED_OUTPATIENT_CLINIC_OR_DEPARTMENT_OTHER): Payer: Self-pay

## 2017-03-26 ENCOUNTER — Emergency Department (HOSPITAL_BASED_OUTPATIENT_CLINIC_OR_DEPARTMENT_OTHER)
Admission: EM | Admit: 2017-03-26 | Discharge: 2017-03-26 | Disposition: A | Discharge status: Home / Self Care | Payer: Self-pay | Attending: Emergency Medicine | Admitting: Emergency Medicine

## 2017-03-26 ENCOUNTER — Encounter (HOSPITAL_BASED_OUTPATIENT_CLINIC_OR_DEPARTMENT_OTHER): Payer: Self-pay | Admitting: Emergency Medicine

## 2017-03-26 ENCOUNTER — Other Ambulatory Visit: Payer: Self-pay

## 2017-03-26 DIAGNOSIS — R42 Dizziness and giddiness: Secondary | ICD-10-CM | POA: Insufficient documentation

## 2017-03-26 DIAGNOSIS — F17210 Nicotine dependence, cigarettes, uncomplicated: Secondary | ICD-10-CM | POA: Insufficient documentation

## 2017-03-26 LAB — CBC WITH DIFFERENTIAL/PLATELET
BASOS PCT: 0 %
Basophils Absolute: 0 10*3/uL (ref 0.0–0.1)
EOS PCT: 3 %
Eosinophils Absolute: 0.2 10*3/uL (ref 0.0–0.7)
HEMATOCRIT: 38.9 % — AB (ref 39.0–52.0)
Hemoglobin: 13.2 g/dL (ref 13.0–17.0)
LYMPHS PCT: 35 %
Lymphs Abs: 2.7 10*3/uL (ref 0.7–4.0)
MCH: 24 pg — ABNORMAL LOW (ref 26.0–34.0)
MCHC: 33.9 g/dL (ref 30.0–36.0)
MCV: 70.6 fL — AB (ref 78.0–100.0)
MONO ABS: 0.7 10*3/uL (ref 0.1–1.0)
Monocytes Relative: 8 %
NEUTROS ABS: 4.2 10*3/uL (ref 1.7–7.7)
Neutrophils Relative %: 54 %
Platelets: 243 10*3/uL (ref 150–400)
RBC: 5.51 MIL/uL (ref 4.22–5.81)
RDW: 15.1 % (ref 11.5–15.5)
WBC: 7.8 10*3/uL (ref 4.0–10.5)

## 2017-03-26 LAB — COMPREHENSIVE METABOLIC PANEL
ALBUMIN: 4.2 g/dL (ref 3.5–5.0)
ALK PHOS: 76 U/L (ref 38–126)
ALT: 30 U/L (ref 17–63)
ANION GAP: 9 (ref 5–15)
AST: 27 U/L (ref 15–41)
BUN: 15 mg/dL (ref 6–20)
CALCIUM: 9.3 mg/dL (ref 8.9–10.3)
CO2: 25 mmol/L (ref 22–32)
Chloride: 103 mmol/L (ref 101–111)
Creatinine, Ser: 1.27 mg/dL — ABNORMAL HIGH (ref 0.61–1.24)
GFR calc Af Amer: >60 mL/min (ref >60)
GFR calc non Af Amer: >60 mL/min (ref >60)
Glucose, Bld: 91 mg/dL (ref 65–99)
Potassium: 4.3 mmol/L (ref 3.5–5.1)
SODIUM: 137 mmol/L (ref 135–145)
Total Bilirubin: 0.5 mg/dL (ref 0.3–1.2)
Total Protein: 8.1 g/dL (ref 6.5–8.1)

## 2017-03-26 LAB — CBG MONITORING, ED: Glucose-Capillary: 96 mg/dL (ref 65–99)

## 2017-03-26 MED ORDER — MECLIZINE HCL 25 MG PO TABS: 25.0000 mg | ORAL_TABLET | Freq: Three times a day (TID) | ORAL | 0 refills | Status: AC | PRN

## 2017-03-26 MED ORDER — SODIUM CHLORIDE 0.9 % IV BOLUS (SEPSIS): 500.0000 mL | Freq: Once | INTRAVENOUS | Status: DC

## 2017-03-26 NOTE — ED Notes (Signed)
Told Pt I'll do orthostatics after CT is done.

## 2017-03-26 NOTE — Discharge Instructions (Signed)
We believe your symptoms were caused by benign vertigo.  Please read through the included information and take any prescribed medication(s).  Follow up with your doctor as listed above.  If you develop any new or worsening symptoms that concern you, including but not limited to persistent dizziness/vertigo, numbness or weakness in your arms or legs, altered mental status, persistent vomiting, or fever greater than 101, please return immediately to the Emergency Department.  

## 2017-03-26 NOTE — ED Notes (Addendum)
Alert, NAD, calm, interactive, resps e/u, speaking in clear complete sentences, no dyspnea noted, skin W&D, VSS, (denies: pain, sob, nausea, dizziness or visual changes). 

## 2017-03-26 NOTE — ED Notes (Signed)
Pt given d/c instructions as per chart. Rx x 1. Verbalizes understanding. No questions. 

## 2017-03-26 NOTE — ED Notes (Signed)
Back from CT, alert, NAD, calm, rapid steady gait.

## 2017-03-26 NOTE — ED Triage Notes (Signed)
Intermittent dizziness for 4 days with headache. Denies chest pain, SOB, weakness to arms/legs.

## 2017-03-26 NOTE — ED Provider Notes (Signed)
Emergency Department Provider Note   I have reviewed the triage vital signs and the nursing notes.   HISTORY  Chief Complaint Dizziness   HPI Russell Sanchez is a 42 y.o. male with PMH of chronic HA presents to the ED with intermittent dizziness for the last 4 days. Describes the symptoms as lightheadedness with occasional vertigo symptoms. No numbness or tingling. Symptoms occur with typical migraine HA symptoms but also without HA symptoms at times. No fever/chills. No vomiting. No head injury. No CP, SOB, or palpitations.    Past Medical History:  Diagnosis Date  . Generalized headaches   . Hernia, inguinal    as a child, no repair  . Ulcer, stomach peptic     There are no active problems to display for this patient.   History reviewed. No pertinent surgical history.  Current Outpatient Rx  . Order #: [id number] Class: Print  . Order #: [id number] Class: Print  . Order #: [id number] Class: Print  . Order #: [id number] Class: Print  . Order #: [id number] Class: Print  . Order #: [id number] Class: Print    Allergies Dilaudid [hydromorphone hcl]  No family history on file.  Social History Social History   Tobacco Use  . Smoking status: Current Every Day Smoker    Packs/day: 0.50    Types: Cigarettes  . Smokeless tobacco: Never Used  Substance Use Topics  . Alcohol use: No  . Drug use: No    Review of Systems  Constitutional: No fever/chills. Positive lightheadedness.  Eyes: No visual changes. ENT: No sore throat. Positive vertigo.  Cardiovascular: Denies chest pain. Respiratory: Denies shortness of breath. Gastrointestinal: No abdominal pain. No nausea, no vomiting.  No diarrhea.  No constipation. Genitourinary: Negative for dysuria. Musculoskeletal: Negative for back pain. Skin: Negative for rash. Neurological: Negative for focal weakness or numbness. Positive HA.   10-point ROS otherwise negative.  ____________________________________________   PHYSICAL  EXAM:  VITAL SIGNS: ED Triage Vitals [03/26/17 1853]  Enc Vitals Group     BP 118/76     Pulse Rate 86     Resp 18     Temp 98.7 F (37.1 C)     Temp Source Oral     SpO2 100 %     Weight      Height 6\' 2"  (1.88 m)     Pain Score 0   Constitutional: Alert and oriented. Well appearing and in no acute distress. Eyes: Conjunctivae are normal. Head: Atraumatic. Nose: No congestion/rhinnorhea. Mouth/Throat: Mucous membranes are moist.  Neck: No stridor.  Cardiovascular: Normal rate, regular rhythm. Good peripheral circulation. Grossly normal heart sounds.   Respiratory: Normal respiratory effort.  No retractions. Lungs CTAB. Gastrointestinal: Soft and nontender. No distention.  Musculoskeletal: No lower extremity tenderness nor edema. No gross deformities of extremities. Neurologic:  Normal speech and language. No gross focal neurologic deficits are appreciated. Normal CN exam. Normal gait. Normal Romberg.  Skin:  Skin is warm, dry and intact. No rash noted. ____________________________________________   LABS (all labs ordered are listed, but only abnormal results are displayed)  Labs Reviewed  COMPREHENSIVE METABOLIC PANEL - Abnormal; Notable for the following components:      Result Value   Creatinine, Ser 1.27 (*)    All other components within normal limits  CBC WITH DIFFERENTIAL/PLATELET - Abnormal; Notable for the following components:   HCT 38.9 (*)    MCV 70.6 (*)    MCH 24.0 (*)    All other components within normal limits  CBG MONITORING, ED  ____________________________________________  EKG   EKG Interpretation  Date/Time:  Saturday March 26 2017 18:57:56 EDT Ventricular Rate:  79 PR Interval:  164 QRS Duration: 74 QT Interval:  332 QTC Calculation: 380 R Axis:   73 Text Interpretation:  Normal sinus rhythm Normal ECG No STEMI.  Confirmed by Alona BeneLong, Joshua [phone]) on 03/26/2017 8:29:49 PM Also confirmed by Alona BeneLong, Joshua [phone]), editor Barbette Hairassel, Kerry  [phone])  on 03/27/2017 8:27:21 AM       ____________________________________________  RADIOLOGY  Ct Head Wo Contrast  Result Date: 03/26/2017 CLINICAL DATA:  Dizziness after waking EXAM: CT HEAD WITHOUT CONTRAST TECHNIQUE: Contiguous axial images were obtained from the base of the skull through the vertex without intravenous contrast. COMPARISON:  None. FINDINGS: Brain: No mass lesion, intraparenchymal hemorrhage or extra-axial collection. No evidence of acute cortical infarct. Normal appearance of the brain parenchyma and extra axial spaces for age. Vascular: No hyperdense vessel or unexpected vascular calcification. Skull: Normal visualized skull base, calvarium and extracranial soft tissues. Sinuses/Orbits: No sinus fluid levels or advanced mucosal thickening. No mastoid effusion. Normal orbits. IMPRESSION: Normal head CT. Electronically Signed   By: Deatra RobinsonKevin  Herman M.D.   On: 03/26/2017 22:00    ____________________________________________   PROCEDURES  Procedure(s) performed:   Procedures  None ____________________________________________   INITIAL IMPRESSION / ASSESSMENT AND PLAN / ED COURSE  Pertinent labs & imaging results that were available during my care of the patient were reviewed by me and considered in my medical decision making (see chart for details).  Patient presents to the ED with chronic HA, vertigo, and lightheadedness. No fever or infection symptoms. Patient with normal exam. Labs, EKG, and imaging reviewed with no acute findings. No concern for central etiology of symptoms. Plan for PCP and Cardiology follow up.   At this time, I do not feel there is any life-threatening condition present. I have reviewed and discussed all results (EKG, imaging, lab, urine as appropriate), exam findings with patient. I have reviewed nursing notes and appropriate previous records.  I feel the patient is safe to be discharged home without further emergent workup. Discussed usual  and customary return precautions. Patient and family (if present) verbalize understanding and are comfortable with this plan.  Patient will follow-up with their primary care provider. If they do not have a primary care provider, information for follow-up has been provided to them. All questions have been answered.    ____________________________________________  FINAL CLINICAL IMPRESSION(S) / ED DIAGNOSES  Final diagnoses:  Lightheadedness  Vertigo     MEDICATIONS GIVEN DURING THIS VISIT:  Medications - No data to display   NEW OUTPATIENT MEDICATIONS STARTED DURING THIS VISIT:  Discharge Medication List as of 03/26/2017 10:21 PM    START taking these medications   Details  meclizine (ANTIVERT) 25 MG tablet Take 1 tablet (25 mg total) by mouth 3 (three) times daily as needed for dizziness., Starting Sat 03/26/2017, Print        Note:  This document was prepared using Dragon voice recognition software and may include unintentional dictation errors.  Alona BeneJoshua Long, MD Emergency Medicine    Long, Arlyss RepressJoshua G, MD 03/27/17 [phone]

## 2017-03-26 NOTE — ED Notes (Signed)
To CT at this time.
